# Patient Record
Sex: Female | Born: 1940 | Race: Black or African American | Hispanic: No | Marital: Single | Smoking: Never smoker
Health system: Southern US, Community
[De-identification: ages and names within clinical notes are randomized; demographics above are authoritative.]

## PROBLEM LIST (undated history)

## (undated) DIAGNOSIS — I10 Essential (primary) hypertension: Secondary | ICD-10-CM

## (undated) DIAGNOSIS — E079 Disorder of thyroid, unspecified: Secondary | ICD-10-CM

## (undated) DIAGNOSIS — C801 Malignant (primary) neoplasm, unspecified: Secondary | ICD-10-CM

## (undated) HISTORY — PX: OTHER SURGICAL HISTORY: SHX169

---

## 2017-06-17 ENCOUNTER — Emergency Department (HOSPITAL_COMMUNITY): Payer: Medicare Other

## 2017-06-17 ENCOUNTER — Encounter (HOSPITAL_COMMUNITY): Payer: Self-pay | Admitting: Emergency Medicine

## 2017-06-17 ENCOUNTER — Emergency Department (HOSPITAL_COMMUNITY)
Admission: EM | Admit: 2017-06-17 | Discharge: 2017-06-17 | Disposition: A | Discharge status: Home / Self Care | Payer: Medicare Other | Attending: Emergency Medicine | Admitting: Emergency Medicine

## 2017-06-17 DIAGNOSIS — I10 Essential (primary) hypertension: Secondary | ICD-10-CM | POA: Diagnosis not present

## 2017-06-17 DIAGNOSIS — M79604 Pain in right leg: Secondary | ICD-10-CM | POA: Diagnosis not present

## 2017-06-17 HISTORY — DX: Disorder of thyroid, unspecified: E07.9

## 2017-06-17 HISTORY — DX: Essential (primary) hypertension: I10

## 2017-06-17 HISTORY — DX: Malignant (primary) neoplasm, unspecified: C80.1

## 2017-06-17 LAB — I-STAT CHEM 8, ED
BUN: 30 mg/dL — ABNORMAL HIGH (ref 6–20)
CREATININE: 1 mg/dL (ref 0.44–1.00)
Calcium, Ion: 1.15 mmol/L (ref 1.15–1.40)
Chloride: 101 mmol/L (ref 101–111)
GLUCOSE: 140 mg/dL — AB (ref 65–99)
HCT: 37 % (ref 36.0–46.0)
HEMOGLOBIN: 12.6 g/dL (ref 12.0–15.0)
POTASSIUM: 3.7 mmol/L (ref 3.5–5.1)
Sodium: 141 mmol/L (ref 135–145)
TCO2: 33 mmol/L (ref 0–100)

## 2017-06-17 MED ORDER — METHOCARBAMOL 500 MG PO TABS
500.0000 mg | ORAL_TABLET | Freq: Once | ORAL | Status: AC
  Administered 2017-06-17: 500 mg via ORAL
  Filled 2017-06-17: qty 1

## 2017-06-17 MED ORDER — METHOCARBAMOL 500 MG PO TABS: 500.0000 mg | ORAL_TABLET | Freq: Two times a day (BID) | ORAL | 0 refills | Status: AC

## 2017-06-17 MED ORDER — ENOXAPARIN SODIUM 60 MG/0.6ML ~~LOC~~ SOLN
1.0000 mg/kg | Freq: Once | SUBCUTANEOUS | Status: AC
  Administered 2017-06-17: 23:00:00 50 mg via SUBCUTANEOUS
  Filled 2017-06-17: qty 0.6

## 2017-06-17 NOTE — ED Provider Notes (Signed)
[facility] DEPT Provider Note   CSN: [id number] Arrival date & time: 06/17/17  1559     History   Chief Complaint Chief Complaint  Patient presents with  . Leg Pain    spasm    HPI Susan Wu is a 76 y.o. female.  The history is provided by the patient and medical records.     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 Patient is currently visiting from [place].  Her son drove down with her a few days ago, about a 9 hour car ride.  She reports they did stop and stretch a few times on the way down. States over the past 2-3 days she has had some cramping in her right posterior thigh and some pain in the right knee. States pain is fairly constant, not necessarily worse with walking but when she tries to put pressure on her right foot she feels like her leg is "tight" as if it may give out. She has not had any falls. She denies any numbness or weakness of her right leg. She does not use a cane or walker to assist with ambulation. She has no history of DVT or PE in the past. States while she was in the hospital earlier this year she was given Lovenox shots but is not currently on any type of anticoagulation. She denies any chest pain or shortness of breath. She has no other complaints at this time. She did try taking some Motrin earlier today which helped ease the pain somewhat.  Past Medical History:  Diagnosis Date  . Cancer ([place])   . Hypertension   . Thyroid disease     There are no active problems to display for this patient.   Past Surgical History:  Procedure Laterality Date  . lungectomy      OB History    No data available       Home Medications    Prior to Admission medications   Not on File    Family History No family history on file.  Social History Social History  Substance Use Topics  .  Smoking status: Never Smoker  . Smokeless tobacco: Never Used  . Alcohol use No     Allergies   Patient has no allergy information on record.   Review of Systems Review of Systems  Musculoskeletal: Positive for arthralgias and myalgias.  All other systems reviewed and are negative.    Physical Exam Updated Vital Signs BP 135/64 (BP Location: Right Arm)   Pulse (!) 106   Temp 99.1 F (37.3 C) (Oral)   Resp 16   Ht 5\' 5"  (1.651 m)   Wt 49.4 kg (109 lb)   SpO2 100%   BMI 18.14 kg/m   Physical Exam  Constitutional: She is oriented to person, place, and time. She appears well-developed and well-nourished.  HENT:  Head: Normocephalic and atraumatic.  Mouth/Throat: Oropharynx is clear and moist.  Eyes: Conjunctivae and EOM are normal. Pupils are equal, round, and reactive to light.  Neck: Normal range of motion.  Cardiovascular: Normal rate, regular rhythm and normal heart sounds.   Pulmonary/Chest: Effort normal and breath sounds normal. No respiratory distress. She has no wheezes.  Abdominal: Soft. Bowel sounds are normal. There is no tenderness. There is no rebound.  Musculoskeletal: Normal range of motion.  Right leg overall normal in  appearance without significant swelling, erythema, warmth to touch, or palpable cords; DP pulses intact, normal strength throughout both legs Does have some mild crepitus of the right knee noted with flexion and extension, there is no gross bony deformity or significant effusion  Neurological: She is alert and oriented to person, place, and time.  Skin: Skin is warm and dry.  Psychiatric: She has a normal mood and affect.  Nursing note and vitals reviewed.   ED Treatments / Results  Labs (all labs ordered are listed, but only abnormal results are displayed) Labs Reviewed  I-STAT CHEM 8, ED - Abnormal; Notable for the following:       Result Value   BUN 30 (*)    Glucose, Bld 140 (*)    All other components within normal limits     EKG  EKG Interpretation None       Radiology Dg Knee Complete 4 Views Right  Result Date: 06/17/2017 CLINICAL DATA:  Acute right knee pain for 4 days.  No known injury. EXAM: RIGHT KNEE - COMPLETE 4+ VIEW COMPARISON:  None. FINDINGS: No evidence of fracture, dislocation, or joint effusion. No evidence of arthropathy or other focal bone abnormality. Soft tissues are unremarkable. IMPRESSION: Negative. Electronically Signed   By: Margarette Canada M.D.   On: 06/17/2017 21:39    Procedures Procedures (including critical care time)  Medications Ordered in ED Medications - No data to display   Initial Impression / Assessment and Plan / ED Course  I have reviewed the triage vital signs and the nursing notes.  Pertinent labs & imaging results that were available during my care of the patient were reviewed by me and considered in my medical decision making (see chart for details).  76 year old female here with right leg pain.  Recent travel from [place] via car with her husband, approximately 9 hour trip.  Reports she feels "spasms" in her posterior right thigh.  There is no apparent swelling, erythema, warmth to touch, or palpable cords of the right leg. Her legs are neurovascularly intact. She remains ambulatory with steady gait.  No bony deformities noted on exam, some mild crepitus with flexion and extension. Has been told she has arthritis in the past.  Unfortunately, venous duplex not able to be obtained at this hour. We'll plan for plain film of the knee as well as Chem-8 to check her electrolytes, specifically potassium.  Chemistry is overall reassuring. Knee films without acute findings. DVT still on differential. She has had some relief with robaxin that was given here. Remains without chest pain or SOB.  Patient given injection of Lovenox here and will return to the ED in the morning for venous duplex.  She and son at bedside both acknowledge understanding and agreed with plan of care.  Return precautions given for any new or worsening symptoms.  Final Clinical Impressions(s) / ED Diagnoses   Final diagnoses:  Right leg pain    New Prescriptions New Prescriptions   METHOCARBAMOL (ROBAXIN) 500 MG TABLET    Take 1 tablet (500 mg total) by mouth 2 (two) times daily.     Larene Pickett, PA-C 06/17/17 [id number]    Veryl Speak, MD 06/17/17 [phone]

## 2017-06-17 NOTE — ED Triage Notes (Signed)
Pt. Stated, I started having rt. Leg spasms for 4 days. Im here visiting from [employer] visiting my son.

## 2017-06-17 NOTE — ED Notes (Signed)
Pt refused to get undressed. 

## 2017-06-17 NOTE — ED Notes (Signed)
Patient transported to X-ray 

## 2017-06-17 NOTE — Discharge Instructions (Signed)
Return here in the morning for the ultrasound of your leg.  You need to go to the radiology department to have this done, you do not need to check back into the ED to have this done. Your results will be read tomorrow, if they are abnormal the ED staff will address them. You can return here for any new/worsening symptoms.

## 2017-06-18 ENCOUNTER — Ambulatory Visit (HOSPITAL_COMMUNITY)
Admission: RE | Admit: 2017-06-18 | Discharge: 2017-06-18 | Disposition: A | Discharge status: Home / Self Care | Payer: Medicare Other | Source: Ambulatory Visit | Attending: Emergency Medicine | Admitting: Emergency Medicine

## 2017-06-18 DIAGNOSIS — M79609 Pain in unspecified limb: Secondary | ICD-10-CM | POA: Diagnosis not present

## 2017-06-18 DIAGNOSIS — M79604 Pain in right leg: Secondary | ICD-10-CM | POA: Insufficient documentation

## 2017-06-18 NOTE — Progress Notes (Signed)
Preliminary results by tech - Right Lower Ext. Venous Duplex Completed. Negative for deep and superficial vein thrombosis. RDS

## 2018-11-10 IMAGING — DX DG KNEE COMPLETE 4+V*R*
4 series · 4 of 4 positions shown · non-contrast
Comparison: None.

CLINICAL DATA: Acute right knee pain for 4 days.  No known injury.

EXAM:
RIGHT KNEE - COMPLETE 4+ VIEW

[knee ap]
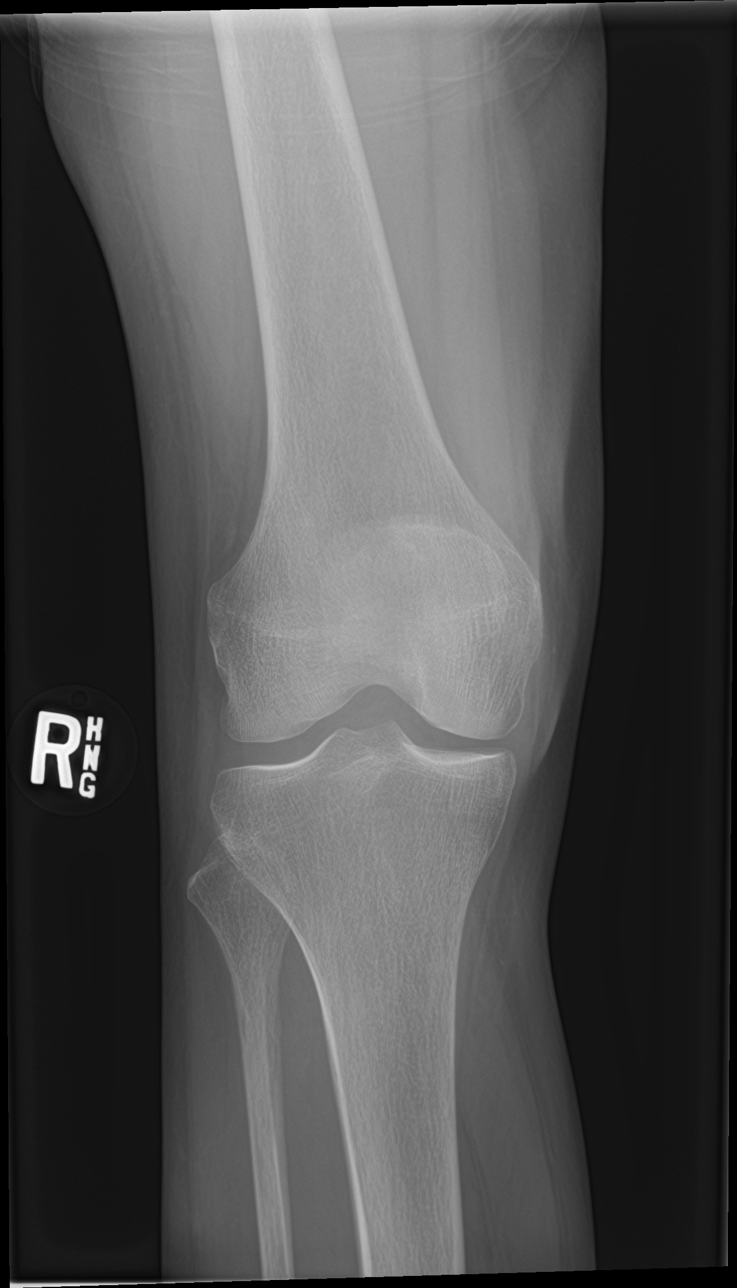

[knee lat]
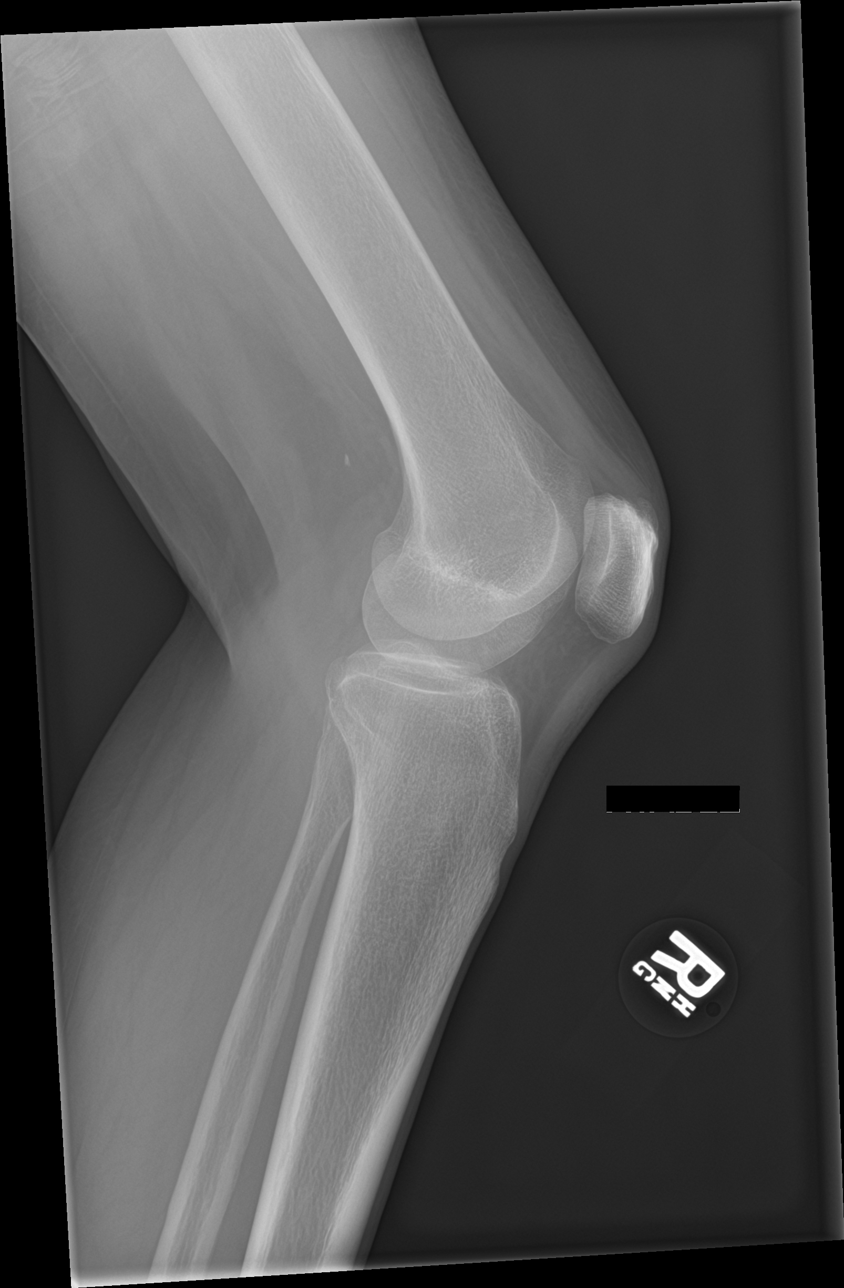

[knee obl (1 of 2)]
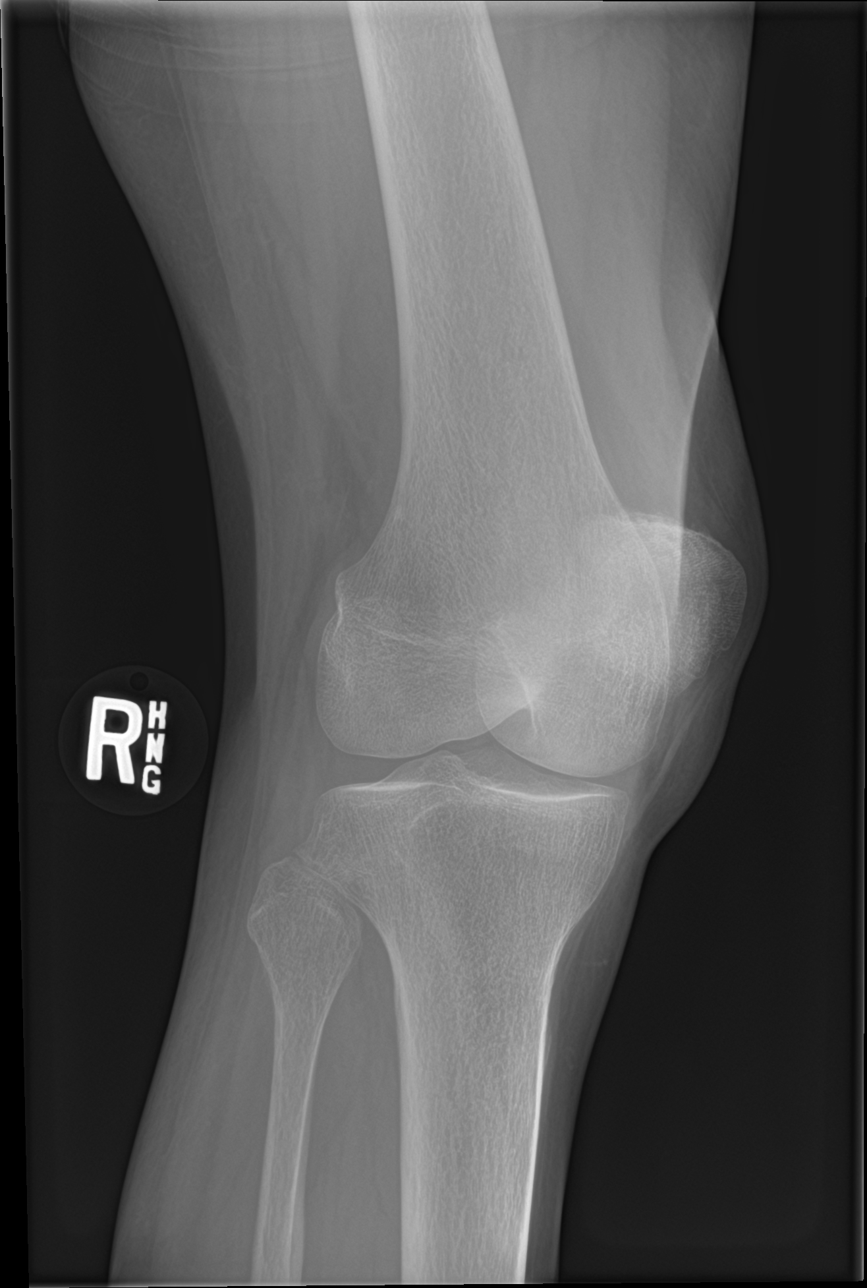

[knee obl (2 of 2)]
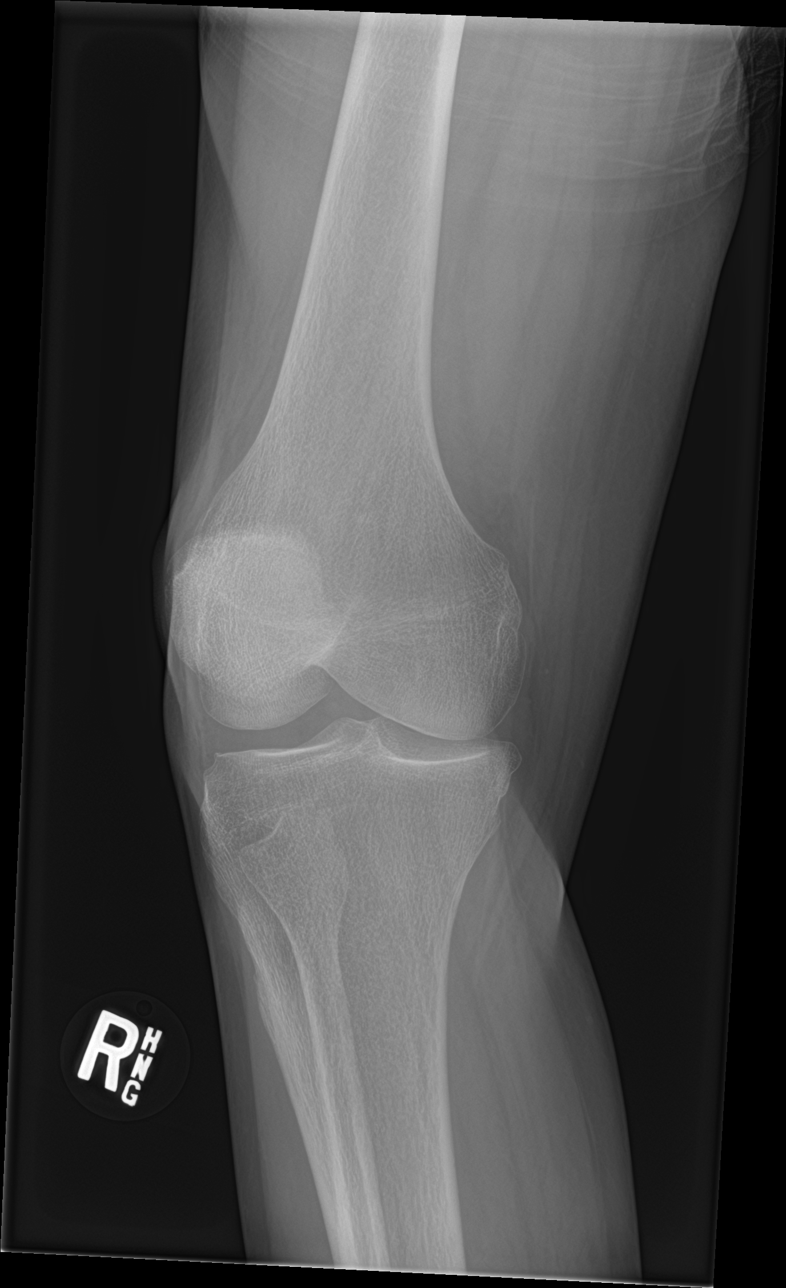

[4 of 4 positions shown; findings below may reference images not displayed]

FINDINGS: No evidence of fracture, dislocation, or joint effusion. No evidence
of arthropathy or other focal bone abnormality. Soft tissues are
unremarkable.
IMPRESSION: Negative.
